# Patient Record
Sex: Female | Born: 1995 | Race: Black or African American | Hispanic: No | Marital: Single | Smoking: Current every day smoker
Health system: Southern US, Community
[De-identification: ages and names within clinical notes are randomized; demographics above are authoritative.]

---

## 2013-09-07 ENCOUNTER — Emergency Department: Payer: Self-pay | Admitting: Emergency Medicine

## 2013-09-07 LAB — COMPREHENSIVE METABOLIC PANEL
ALT: 16 U/L (ref 12–78)
ANION GAP: 7 (ref 7–16)
AST: 20 U/L (ref 0–26)
Albumin: 4.1 g/dL (ref 3.8–5.6)
Alkaline Phosphatase: 116 U/L
BILIRUBIN TOTAL: 0.5 mg/dL (ref 0.2–1.0)
BUN: 13 mg/dL (ref 7–18)
CHLORIDE: 103 mmol/L (ref 98–107)
CO2: 21 mmol/L (ref 21–32)
Calcium, Total: 9.9 mg/dL (ref 9.0–10.7)
Creatinine: 0.94 mg/dL (ref 0.60–1.30)
EGFR (African American): >60
GLUCOSE: 119 mg/dL — AB (ref 65–99)
OSMOLALITY: 264 (ref 275–301)
Potassium: 3.7 mmol/L (ref 3.5–5.1)
SODIUM: 131 mmol/L — AB (ref 136–145)
TOTAL PROTEIN: 9.3 g/dL — AB (ref 6.4–8.6)

## 2013-09-07 LAB — URINALYSIS, COMPLETE
Blood: NEGATIVE
Glucose,UR: NEGATIVE mg/dL (ref 0–75)
Nitrite: POSITIVE
Ph: 5 (ref 4.5–8.0)
Specific Gravity: 1.035 (ref 1.003–1.030)
WBC UR: 30 /HPF (ref 0–5)

## 2013-09-07 LAB — CBC WITH DIFFERENTIAL/PLATELET
BASOS PCT: 0.2 %
Basophil #: 0 10*3/uL (ref 0.0–0.1)
Eosinophil #: 0 10*3/uL (ref 0.0–0.7)
Eosinophil %: 0.1 %
HCT: 45.1 % (ref 35.0–47.0)
HGB: 14.8 g/dL (ref 12.0–16.0)
LYMPHS PCT: 6 %
Lymphocyte #: 1.1 10*3/uL (ref 1.0–3.6)
MCH: 31.1 pg (ref 26.0–34.0)
MCHC: 32.7 g/dL (ref 32.0–36.0)
MCV: 95 fL (ref 80–100)
Monocyte #: 1.3 x10 3/mm — ABNORMAL HIGH (ref 0.2–0.9)
Monocyte %: 7.2 %
NEUTROS ABS: 16.2 10*3/uL — AB (ref 1.4–6.5)
NEUTROS PCT: 86.5 %
PLATELETS: 284 10*3/uL (ref 150–440)
RBC: 4.75 10*6/uL (ref 3.80–5.20)
RDW: 13.8 % (ref 11.5–14.5)
WBC: 18.7 10*3/uL — ABNORMAL HIGH (ref 3.6–11.0)

## 2013-09-07 LAB — LIPASE, BLOOD: Lipase: 55 U/L — ABNORMAL LOW (ref 73–393)

## 2013-09-07 LAB — WET PREP, GENITAL

## 2013-09-07 LAB — TROPONIN I

## 2013-09-07 LAB — GC/CHLAMYDIA PROBE AMP

## 2014-02-09 ENCOUNTER — Emergency Department: Payer: Self-pay | Admitting: Emergency Medicine

## 2014-02-09 LAB — URINALYSIS, COMPLETE
BILIRUBIN, UR: NEGATIVE
Glucose,UR: NEGATIVE mg/dL (ref 0–75)
Ketone: NEGATIVE
Leukocyte Esterase: NEGATIVE
NITRITE: NEGATIVE
Ph: 6 (ref 4.5–8.0)
Protein: 30
Specific Gravity: 1.021 (ref 1.003–1.030)

## 2014-02-09 LAB — CBC
HCT: 36.4 % (ref 35.0–47.0)
HGB: 12 g/dL (ref 12.0–16.0)
MCH: 31 pg (ref 26.0–34.0)
MCHC: 33 g/dL (ref 32.0–36.0)
MCV: 94 fL (ref 80–100)
Platelet: 185 10*3/uL (ref 150–440)
RBC: 3.87 10*6/uL (ref 3.80–5.20)
RDW: 13.6 % (ref 11.5–14.5)
WBC: 8.2 10*3/uL (ref 3.6–11.0)

## 2014-02-09 LAB — HCG, QUANTITATIVE, PREGNANCY: BETA HCG, QUANT.: 29713 m[IU]/mL — AB

## 2014-02-15 ENCOUNTER — Encounter: Payer: Self-pay | Admitting: Obstetrics and Gynecology

## 2014-03-08 ENCOUNTER — Encounter: Payer: Self-pay | Admitting: Maternal & Fetal Medicine

## 2014-08-05 ENCOUNTER — Inpatient Hospital Stay: Payer: Self-pay

## 2014-08-05 LAB — COMPREHENSIVE METABOLIC PANEL
Albumin: 2.5 g/dL — ABNORMAL LOW (ref 3.4–5.0)
Alkaline Phosphatase: 242 U/L — ABNORMAL HIGH (ref 46–116)
Anion Gap: 8 (ref 7–16)
BILIRUBIN TOTAL: 0.3 mg/dL (ref 0.2–1.0)
BUN: 6 mg/dL — ABNORMAL LOW (ref 7–18)
CREATININE: 0.56 mg/dL — AB (ref 0.60–1.30)
Calcium, Total: 8.7 mg/dL (ref 8.5–10.1)
Chloride: 106 mmol/L (ref 98–107)
Co2: 22 mmol/L (ref 21–32)
EGFR (Non-African Amer.): >60
GLUCOSE: 91 mg/dL (ref 65–99)
Osmolality: 269 (ref 275–301)
Potassium: 4 mmol/L (ref 3.5–5.1)
SGOT(AST): 19 U/L (ref 15–37)
SGPT (ALT): 17 U/L (ref 14–63)
SODIUM: 136 mmol/L (ref 136–145)
Total Protein: 6.7 g/dL (ref 6.4–8.2)

## 2014-08-05 LAB — CBC WITH DIFFERENTIAL/PLATELET
Basophil #: 0.1 10*3/uL (ref 0.0–0.1)
Basophil %: 0.5 %
Eosinophil #: 0 10*3/uL (ref 0.0–0.7)
Eosinophil %: 0.4 %
HCT: 35.6 % (ref 35.0–47.0)
HGB: 11.7 g/dL — ABNORMAL LOW (ref 12.0–16.0)
Lymphocyte #: 1.8 10*3/uL (ref 1.0–3.6)
Lymphocyte %: 17.2 %
MCH: 29 pg (ref 26.0–34.0)
MCHC: 32.8 g/dL (ref 32.0–36.0)
MCV: 88 fL (ref 80–100)
MONOS PCT: 6.1 %
Monocyte #: 0.6 x10 3/mm (ref 0.2–0.9)
Neutrophil #: 7.8 10*3/uL — ABNORMAL HIGH (ref 1.4–6.5)
Neutrophil %: 75.8 %
PLATELETS: 224 10*3/uL (ref 150–440)
RBC: 4.03 10*6/uL (ref 3.80–5.20)
RDW: 15.6 % — AB (ref 11.5–14.5)
WBC: 10.2 10*3/uL (ref 3.6–11.0)

## 2014-08-05 LAB — GC/CHLAMYDIA PROBE AMP

## 2014-08-05 LAB — CREATININE, URINE, RANDOM: Creatinine, Urine Random: 143.5 mg/dL — ABNORMAL HIGH (ref 30.0–125.0)

## 2014-08-06 LAB — HEMATOCRIT: HCT: 32.1 % — AB (ref 35.0–47.0)

## 2014-11-06 NOTE — H&P (Signed)
L&D Evaluation:  History:  HPI Pt is a [age] yo pt of [facility] G1P0 at 39.[redacted] weeks GA with an EDC of 08/06/14 presents with reports of contractions and leaking fluid since 7am. She reports going to the bathroom and afterwards her underwear continued to be wet. Her prenatal course is significant for UTI and size discrepency on u/s. She was seen by [facility] for this but no further records are in the chart. She is O+, RI and VI by vaccine and disease, and GBS+.   Presents with contractions, leaking fluid   Medications Pre Natal Vitamins   Allergies NKDA   Social History none   Family History Non-Contributory   ROS:  ROS All systems were reviewed.  HEENT, CNS, GI, GU, Respiratory, CV, Renal and Musculoskeletal systems were found to be normal.   Exam:  Vital Signs mildly elevated BP 140-150s/80s   General no apparent distress   Mental Status clear   Chest clear   Abdomen gravid, tender with contractions   Pelvic 1/75/-1 upon admission. Currently 1.5/80/-2   Mebranes Ruptured, +ferning and nitrazine, +pooling   Description clear   FHT normal rate with no decels, 135, moderate variability, +accels, no decels   Ucx irregular, every 2-5   Skin dry, no lesions, no rashes   Lymph no lymphadenopathy   Other negative PIH labs   Impression:  Impression early labor, IUP at 39.6, SROM   Plan:  Plan EFM/NST, ambulate, consider pitocin if contraction do not cause cervical change.   Follow Up Appointment need to schedule. in 6 weeks   Electronic Signatures: Jannet MantisSubudhi, Floye Fesler (CNM)  (Signed 07-Feb-16 19:07)  Authored: L&D Evaluation   Last Updated: 07-Feb-16 19:07 by Jannet MantisSubudhi, Khloey Chern (CNM)

## 2017-08-10 ENCOUNTER — Encounter: Payer: Self-pay | Admitting: Emergency Medicine

## 2017-08-10 ENCOUNTER — Other Ambulatory Visit: Payer: Self-pay

## 2017-08-10 ENCOUNTER — Emergency Department
Admission: EM | Admit: 2017-08-10 | Discharge: 2017-08-10 | Disposition: A | Discharge status: Home / Self Care | Payer: Self-pay | Attending: Emergency Medicine | Admitting: Emergency Medicine

## 2017-08-10 ENCOUNTER — Emergency Department: Payer: Self-pay

## 2017-08-10 DIAGNOSIS — B9689 Other specified bacterial agents as the cause of diseases classified elsewhere: Secondary | ICD-10-CM

## 2017-08-10 DIAGNOSIS — N390 Urinary tract infection, site not specified: Secondary | ICD-10-CM | POA: Insufficient documentation

## 2017-08-10 DIAGNOSIS — F17200 Nicotine dependence, unspecified, uncomplicated: Secondary | ICD-10-CM | POA: Insufficient documentation

## 2017-08-10 DIAGNOSIS — O4690 Antepartum hemorrhage, unspecified, unspecified trimester: Secondary | ICD-10-CM

## 2017-08-10 DIAGNOSIS — O200 Threatened abortion: Secondary | ICD-10-CM | POA: Insufficient documentation

## 2017-08-10 DIAGNOSIS — N760 Acute vaginitis: Secondary | ICD-10-CM | POA: Insufficient documentation

## 2017-08-10 LAB — CBC WITH DIFFERENTIAL/PLATELET
BASOS PCT: 0 %
Basophils Absolute: 0 10*3/uL (ref 0–0.1)
EOS ABS: 0.1 10*3/uL (ref 0–0.7)
Eosinophils Relative: 2 %
HEMATOCRIT: 40.5 % (ref 35.0–47.0)
HEMOGLOBIN: 13.5 g/dL (ref 12.0–16.0)
Lymphocytes Relative: 25 %
Lymphs Abs: 1.6 10*3/uL (ref 1.0–3.6)
MCH: 31 pg (ref 26.0–34.0)
MCHC: 33.2 g/dL (ref 32.0–36.0)
MCV: 93.5 fL (ref 80.0–100.0)
Monocytes Absolute: 0.6 10*3/uL (ref 0.2–0.9)
Monocytes Relative: 9 %
NEUTROS ABS: 4.2 10*3/uL (ref 1.4–6.5)
NEUTROS PCT: 64 %
Platelets: 229 10*3/uL (ref 150–440)
RBC: 4.34 MIL/uL (ref 3.80–5.20)
RDW: 13.7 % (ref 11.5–14.5)
WBC: 6.6 10*3/uL (ref 3.6–11.0)

## 2017-08-10 LAB — URINALYSIS, ROUTINE W REFLEX MICROSCOPIC
Bilirubin Urine: NEGATIVE
GLUCOSE, UA: NEGATIVE mg/dL
KETONES UR: NEGATIVE mg/dL
Nitrite: POSITIVE — AB
PH: 5 (ref 5.0–8.0)
PROTEIN: 30 mg/dL — AB
Specific Gravity, Urine: 1.019 (ref 1.005–1.030)

## 2017-08-10 LAB — CHLAMYDIA/NGC RT PCR (ARMC ONLY)
Chlamydia Tr: NOT DETECTED
N GONORRHOEAE: NOT DETECTED

## 2017-08-10 LAB — BASIC METABOLIC PANEL
ANION GAP: 5 (ref 5–15)
BUN: 7 mg/dL (ref 6–20)
CALCIUM: 8.8 mg/dL — AB (ref 8.9–10.3)
CO2: 24 mmol/L (ref 22–32)
CREATININE: 0.67 mg/dL (ref 0.44–1.00)
Chloride: 107 mmol/L (ref 101–111)
GFR calc non Af Amer: >60 mL/min (ref >60)
Glucose, Bld: 104 mg/dL — ABNORMAL HIGH (ref 65–99)
Potassium: 3.4 mmol/L — ABNORMAL LOW (ref 3.5–5.1)
SODIUM: 136 mmol/L (ref 135–145)

## 2017-08-10 LAB — WET PREP, GENITAL
Sperm: NONE SEEN
TRICH WET PREP: NONE SEEN
YEAST WET PREP: NONE SEEN

## 2017-08-10 LAB — POCT PREGNANCY, URINE: Preg Test, Ur: POSITIVE — AB

## 2017-08-10 LAB — ABO/RH: ABO/RH(D): O POS

## 2017-08-10 LAB — HCG, QUANTITATIVE, PREGNANCY: HCG, BETA CHAIN, QUANT, S: 7113 m[IU]/mL — AB (ref <5)

## 2017-08-10 MED ORDER — SODIUM CHLORIDE 0.9 % IV SOLN
1.0000 g | Freq: Once | INTRAVENOUS | Status: DC
  Filled 2017-08-10: qty 10

## 2017-08-10 MED ORDER — CEPHALEXIN 500 MG PO CAPS
500.0000 mg | ORAL_CAPSULE | Freq: Once | ORAL | Status: AC
  Administered 2017-08-10: 500 mg via ORAL
  Filled 2017-08-10: qty 1

## 2017-08-10 MED ORDER — CEPHALEXIN 500 MG PO CAPS: 500.0000 mg | ORAL_CAPSULE | Freq: Two times a day (BID) | ORAL | 0 refills | Status: AC

## 2017-08-10 NOTE — ED Notes (Signed)
Pt verbalizes d/c understanding and follow up with RX. Pt in NAD , VS stable

## 2017-08-10 NOTE — ED Notes (Signed)
Moderate vaginal bleeding that began last night. Pt last period in December. Denies pain.

## 2017-08-10 NOTE — Discharge Instructions (Signed)
You were evaluated for positive pregnancy test and bleeding, and right now the ultrasound shows approximately 6-week pregnancy with a heartbeat.  You are found to have a urinary tract infection, complete your antibiotic course.  Your vaginal swabs show bacterial overgrowth called bacterial vaginosis.  Please discuss with your OB/GYN whether or not they would like to treat now versus wait until the second trimester.  Return to the emergency department immediately for any worsening condition including uncontrolled or heavy bleeding, dizziness or passing out, abdominal pain, or any other symptoms concerning to you.

## 2017-08-10 NOTE — ED Provider Notes (Signed)
[facility] Emergency Department Provider Note ____________________________________________   I have reviewed the triage vital signs and the triage nursing note.  HISTORY  Chief Complaint Vaginal Bleeding   Historian Patient  HPI Kelly Schwartz is a 22 y.o. female G4 or 5 per the patient, one live birth, others were abortions, reports that she took a pregnancy test at home that looked like it was positive.  She has had some moderate spotting/vaginal bleeding today.  No significant abdominal pain.  Denies urinary symptoms.  Symptoms mild.   History reviewed. No pertinent past medical history.  There are no active problems to display for this patient.   History reviewed. No pertinent surgical history.  Prior to Admission medications   Medication Sig Start Date End Date Taking? Authorizing Provider  cephALEXin (KEFLEX) 500 MG capsule Take 1 capsule (500 mg total) by mouth 2 (two) times daily. 08/10/17   Governor Rooks, MD    No Known Allergies  No family history on file.  Social History Social History   Tobacco Use  . Smoking status: Current Every Day Smoker    Packs/day: 0.50  Substance Use Topics  . Alcohol use: No    Frequency: Never  . Drug use: No    Review of Systems  Constitutional: Negative for fever. Eyes: Negative for visual changes. ENT: Negative for sore throat. Cardiovascular: Negative for chest pain. Respiratory: Negative for shortness of breath. Gastrointestinal: Negative for abdominal pain, vomiting and diarrhea. Genitourinary: Negative for dysuria. Musculoskeletal: Negative for back pain. Skin: Negative for rash. Neurological: Negative for headache.  ____________________________________________   PHYSICAL EXAM:  VITAL SIGNS: ED Triage Vitals  Enc Vitals Group     BP 08/10/17 1156 (!) 126/53     Pulse Rate 08/10/17 1156 78     Resp 08/10/17 1156 18     Temp 08/10/17 1156 98.4 F (36.9 C)     Temp  Source 08/10/17 1156 Oral     SpO2 08/10/17 1156 100 %     Weight 08/10/17 1157 160 lb (72.6 kg)     Height 08/10/17 1157 4\' 11"  (1.499 m)     Head Circumference --      Peak Flow --      Pain Score 08/10/17 1157 0     Pain Loc --      Pain Edu? --      Excl. in GC? --      Constitutional: Alert and oriented. Well appearing and in no distress. HEENT   Head: Normocephalic and atraumatic.      Eyes: Conjunctivae are normal. Pupils equal and round.       Ears:         Nose: No congestion/rhinnorhea.   Mouth/Throat: Mucous membranes are moist.   Neck: No stridor. Cardiovascular/Chest: Normal rate, regular rhythm.  No murmurs, rubs, or gallops. Respiratory: Normal respiratory effort without tachypnea nor retractions. Breath sounds are clear and equal bilaterally. No wheezes/rales/rhonchi. Gastrointestinal: Soft. No distention, no guarding, no rebound. Nontender.    Genitourinary/rectal: Small amount of vaginal blood in the vault, closed cervix.  Not on tender adnexa and no cervicitis/PID symptoms. Musculoskeletal: Nontender with normal range of motion in all extremities. No joint effusions.  No lower extremity tenderness.  No edema. Neurologic:  Normal speech and language. No gross or focal neurologic deficits are appreciated. Skin:  Skin is warm, dry and intact. No rash noted. Psychiatric: Mood and affect are normal. Speech and behavior are normal. Patient exhibits appropriate insight and  judgment.   ____________________________________________  LABS (pertinent positives/negatives) I, Governor Rooksebecca Symphonie Schneiderman, MD the attending physician have reviewed the labs noted below.  Labs Reviewed  WET PREP, GENITAL - Abnormal; Notable for the following components:      Result Value   Clue Cells Wet Prep HPF POC PRESENT (*)    WBC, Wet Prep HPF POC FEW (*)    All other components within normal limits  URINALYSIS, ROUTINE W REFLEX MICROSCOPIC - Abnormal; Notable for the following components:    Color, Urine AMBER (*)    APPearance CLOUDY (*)    Hgb urine dipstick LARGE (*)    Protein, ur 30 (*)    Nitrite POSITIVE (*)    Leukocytes, UA SMALL (*)    Bacteria, UA MANY (*)    Squamous Epithelial / LPF 6-30 (*)    All other components within normal limits  BASIC METABOLIC PANEL - Abnormal; Notable for the following components:   Potassium 3.4 (*)    Glucose, Bld 104 (*)    Calcium 8.8 (*)    All other components within normal limits  HCG, QUANTITATIVE, PREGNANCY - Abnormal; Notable for the following components:   hCG, Beta Chain, Quant, S 7,113 (*)    All other components within normal limits  POCT PREGNANCY, URINE - Abnormal; Notable for the following components:   Preg Test, Ur POSITIVE (*)    All other components within normal limits  CHLAMYDIA/NGC RT PCR ([facility] ONLY)  URINE CULTURE  CBC WITH DIFFERENTIAL/PLATELET  POC URINE PREG, ED  ABO/RH    ____________________________________________  RADIOLOGY All Xrays were viewed by me.  Imaging interpreted by Radiologist, and I, Governor Rooksebecca Lenzi Marmo, MD the attending physician have reviewed the radiologist interpretation noted below.  Ultrasound pelvic and transvaginal:  IMPRESSION: Single viable intrauterine pregnancy at 6 weeks 4 days. Fetal heart rate 112 beats per minute. Small amount of fluid noted adjacent to the left ovary. __________________________________________  PROCEDURES  Procedure(s) performed: None  Critical Care performed: None   ____________________________________________  ED COURSE / ASSESSMENT AND PLAN  Pertinent labs & imaging results that were available during my care of the patient were reviewed by me and considered in my medical decision making (see chart for details).    Patient with stable vital signs, complaining of vaginal bleeding with positive pregnancy test.  Ultrasound shows live IUP, discussed first trimester bleeding precautions with patient and concern about possible  threatened miscarriage.  She is also found to have a urinary tract infection and will be discharged with Keflex.  Vaginal swab positive for bacterial vaginosis, will leave discretion of treatment for pectoral vaginosis to the OB/GYN doctor.  DIFFERENTIAL DIAGNOSIS: Differential diagnosis includes, but is not limited to, threatened miscarriage, incomplete miscarriage, normal bleeding from an early trimester pregnancy, ectopic pregnancy, , blighted ovum, vaginal/cervical trauma, subchorionic hemorrhage/hematoma, etc.   CONSULTATIONS: None   Patient / Family / Caregiver informed of clinical course, medical decision-making process, and agree with plan.   I discussed return precautions, follow-up instructions, and discharge instructions with patient and/or family.  Discharge Instructions : You were evaluated for positive pregnancy test and bleeding, and right now the ultrasound shows approximately 6-week pregnancy with a heartbeat.  You are found to have a urinary tract infection, complete your antibiotic course.  Your vaginal swabs show bacterial overgrowth called bacterial vaginosis.  Please discuss with your OB/GYN whether or not they would like to treat now versus wait until the second trimester.  Return to the emergency department immediately for any  worsening condition including uncontrolled or heavy bleeding, dizziness or passing out, abdominal pain, or any other symptoms concerning to you.    ___________________________________________   FINAL CLINICAL IMPRESSION(S) / ED DIAGNOSES   Final diagnoses:  Vaginal bleeding in pregnancy  BV (bacterial vaginosis)  Threatened miscarriage  Urinary tract infection without hematuria, site unspecified      ___________________________________________        Note: This dictation was prepared with Dragon dictation. Any transcriptional errors that result from this process are unintentional    Governor Rooks, MD 08/10/17  1513

## 2017-08-10 NOTE — ED Triage Notes (Signed)
Pt reports that she took a pregnancy test last week, it said it was positive. She states that when she wiped this am she had a pink bloody show on the tissue paper. Denies any cramping. Last period she had was in December.

## 2017-08-13 ENCOUNTER — Emergency Department
Admission: EM | Admit: 2017-08-13 | Discharge: 2017-08-13 | Disposition: A | Discharge status: Home / Self Care | Payer: Self-pay

## 2017-08-13 NOTE — ED Notes (Signed)
Pt up to stat desk letting registration know that she has decided to leave due to wait times, this tech advised pt to stay and pt insisted on leaving, RN Fleet ContrasRachel notified.

## 2017-08-14 ENCOUNTER — Emergency Department: Payer: Self-pay

## 2017-08-14 ENCOUNTER — Emergency Department
Admission: EM | Admit: 2017-08-14 | Discharge: 2017-08-14 | Disposition: A | Discharge status: Home / Self Care | Payer: Self-pay | Attending: Emergency Medicine | Admitting: Emergency Medicine

## 2017-08-14 ENCOUNTER — Other Ambulatory Visit: Payer: Self-pay

## 2017-08-14 ENCOUNTER — Encounter: Payer: Self-pay | Admitting: Emergency Medicine

## 2017-08-14 DIAGNOSIS — R102 Pelvic and perineal pain: Secondary | ICD-10-CM | POA: Insufficient documentation

## 2017-08-14 DIAGNOSIS — Z3A01 Less than 8 weeks gestation of pregnancy: Secondary | ICD-10-CM | POA: Insufficient documentation

## 2017-08-14 DIAGNOSIS — O039 Complete or unspecified spontaneous abortion without complication: Secondary | ICD-10-CM | POA: Insufficient documentation

## 2017-08-14 DIAGNOSIS — F17200 Nicotine dependence, unspecified, uncomplicated: Secondary | ICD-10-CM | POA: Insufficient documentation

## 2017-08-14 LAB — HCG, QUANTITATIVE, PREGNANCY: HCG, BETA CHAIN, QUANT, S: 349 m[IU]/mL — AB (ref <5)

## 2017-08-14 NOTE — ED Notes (Signed)
Dr. Brown at bedside

## 2017-08-14 NOTE — ED Triage Notes (Signed)
Pt to ed with c/o vaginal bleeding, passing clots and mild cramping.  Pt states she is approx 6 weeks preg.  Pt states recently seen here and started on ABX for infection.

## 2017-08-14 NOTE — ED Notes (Signed)
Pt states she is 6 weeks 4 days pregnant. Having vaginal bleeding x 1 week. States clots present. 1-2 pads per hour. Alert, oriented, ambulatory. No distress noted. "2nd active pregnant, I've hard a few abortions."

## 2017-08-14 NOTE — ED Provider Notes (Signed)
[facility] Emergency Department Provider Note    First MD Initiated Contact with Patient 08/14/17 1738     (approximate)  I have reviewed the triage vital signs and the nursing notes.   HISTORY  Chief Complaint Vaginal Bleeding    HPI Kelly Schwartz is a 22 y.o. female G4P1 (2 elective abortions) approximately [redacted] weeks pregnant presents to the emergency department with increasing vaginal bleeding and clots with mild pelvic cramping times 2 days.  Patient was seen in the emergency department on 08/10/2017 for similar complaint at which point a single live IUP was noted on ultrasound with a beta quantitative of 7113.  Patient states that she is passing large clots.  Current pain score is 0   Past medical history 2 elective abortions. There are no active problems to display for this patient.   Past surgical history None  Prior to Admission medications   Medication Sig Start Date End Date Taking? Authorizing Provider  cephALEXin (KEFLEX) 500 MG capsule Take 1 capsule (500 mg total) by mouth 2 (two) times daily. 08/10/17   Governor RooksLord, Rebecca, MD    Allergies No known drug allergies History reviewed. No pertinent family history.  Social History Social History   Tobacco Use  . Smoking status: Current Every Day Smoker    Packs/day: 0.50  . Smokeless tobacco: Never Used  Substance Use Topics  . Alcohol use: No    Frequency: Never  . Drug use: No    Review of Systems Constitutional: No fever/chills Eyes: No visual changes. ENT: No sore throat. Cardiovascular: Denies chest pain. Respiratory: Denies shortness of breath. Gastrointestinal: No abdominal pain.  No nausea, no vomiting.  No diarrhea.  No constipation. Genitourinary: Positive for vaginal bleeding Musculoskeletal: Negative for neck pain.  Negative for back pain. Integumentary: Negative for rash. Neurological: Negative for headaches, focal weakness or  numbness.   ____________________________________________   PHYSICAL EXAM:  VITAL SIGNS: ED Triage Vitals [08/14/17 1504]  Enc Vitals Group     BP 116/70     Pulse Rate 64     Resp 16     Temp 97.8 F (36.6 C)     Temp Source Oral     SpO2 100 %     Weight 72.6 kg (160 lb)     Height      Head Circumference      Peak Flow      Pain Score 0     Pain Loc      Pain Edu?      Excl. in GC?     Constitutional: Alert and oriented. Well appearing and in no acute distress. Eyes: Conjunctivae are normal. Head: Atraumatic. Nose: No congestion/rhinnorhea. Mouth/Throat: Mucous membranes are moist. Oropharynx non-erythematous. Neck: No stridor.  Cardiovascular: Normal rate, regular rhythm. Good peripheral circulation. Grossly normal heart sounds. Respiratory: Normal respiratory effort.  No retractions. Lungs CTAB. Genitourinary: Cervical os closed moderate vaginal bleeding noted Gastrointestinal: Soft and nontender. No distention.  Musculoskeletal: No lower extremity tenderness nor edema. No gross deformities of extremities. Neurologic:  Normal speech and language. No gross focal neurologic deficits are appreciated.  Skin:  Skin is warm, dry and intact. No rash noted. Psychiatric: Mood and affect are normal. Speech and behavior are normal.  ____________________________________________   LABS (all labs ordered are listed, but only abnormal results are displayed)  Labs Reviewed  HCG, QUANTITATIVE, PREGNANCY - Abnormal; Notable for the following components:      Result Value   hCG, Beta  Chain, Quant, S 349 (*)    All other components within normal limits   _  RADIOLOGY I, Comfrey N Haleemah Buckalew, personally viewed and evaluated these images (plain radiographs) as part of my medical decision making, as well as reviewing the written report by the radiologist.   Official radiology report(s): US Ob Comp Less 14 Wks  Result Date: 08/14/2017 CLINICAL DATA:  Vaginal bleeding. A single  intrauterine pregnancy with seen with ultrasound on August 10, 2017. EXAM: OBSTETRIC <14 WK ULTRASOUND TECHNIQUE: Transabdominal ultrasound was performed for evaluation of the gestation as well as the maternal uterus and adnexal regions. COMPARISON:  August 10, 2017 FINDINGS: Intrauterine gestational sac: None Maternal uterus/adnexae: The ovaries are normal in appearance. A posterior fibroid in the uterus measures up to 3.3 cm. The endometrium is non thickened, measuring 3.3 mm. IMPRESSION: 1. The intrauterine pregnancy identified August 10, 2017 is no longer visualized. Electronically Signed   By: Gerome Sam III M.D   On: 08/14/2017 19:55   US Ob Transvaginal  Result Date: 08/14/2017 CLINICAL DATA:  Vaginal bleeding. A single intrauterine pregnancy with seen with ultrasound on August 10, 2017. EXAM: OBSTETRIC <14 WK ULTRASOUND TECHNIQUE: Transabdominal ultrasound was performed for evaluation of the gestation as well as the maternal uterus and adnexal regions. COMPARISON:  August 10, 2017 FINDINGS: Intrauterine gestational sac: None Maternal uterus/adnexae: The ovaries are normal in appearance. A posterior fibroid in the uterus measures up to 3.3 cm. The endometrium is non thickened, measuring 3.3 mm. IMPRESSION: 1. The intrauterine pregnancy identified August 10, 2017 is no longer visualized. Electronically Signed   By: Gerome Sam III M.D   On: 08/14/2017 19:55     Procedures   ____________________________________________   INITIAL IMPRESSION / ASSESSMENT AND PLAN / ED COURSE  As part of my medical decision making, I reviewed the following data within the electronic MEDICAL RECORD NUMBER  22 year old female with above-stated history and physical exam with concern for possible complete versus incomplete miscarriage such ultrasound was performed which revealed no evidence of an intrauterine pregnancy which was previously identified on previous ultrasound.  As such suspect completed  miscarriage.  Patient referred to OB/GYN for further outpatient evaluation and    ____________________________________________  FINAL CLINICAL IMPRESSION(S) / ED DIAGNOSES  Final diagnoses:  Miscarriage     MEDICATIONS GIVEN DURING THIS VISIT:  Medications - No data to display   ED Discharge Orders    None       Note:  This document was prepared using Dragon voice recognition software and may include unintentional dictation errors.    Darci Current, MD 08/14/17 2203

## 2018-02-24 IMAGING — US US OB COMP LESS 14 WK
1 series · 14 of 28 positions shown · non-contrast
Comparison: 03/08/2014.

CLINICAL DATA: Vaginal spotting.

EXAM:
OBSTETRIC <14 WK US AND TRANSVAGINAL OB US
TECHNIQUE: Both transabdominal and transvaginal ultrasound examinations were
performed for complete evaluation of the gestation as well as the
maternal uterus, adnexal regions, and pelvic cul-de-sac.
Transvaginal technique was performed to assess early pregnancy.

[Series 1: us ob comp less 14 wk · 0.09mm/px · 14 of 126 slices shown]
[im 5/126]
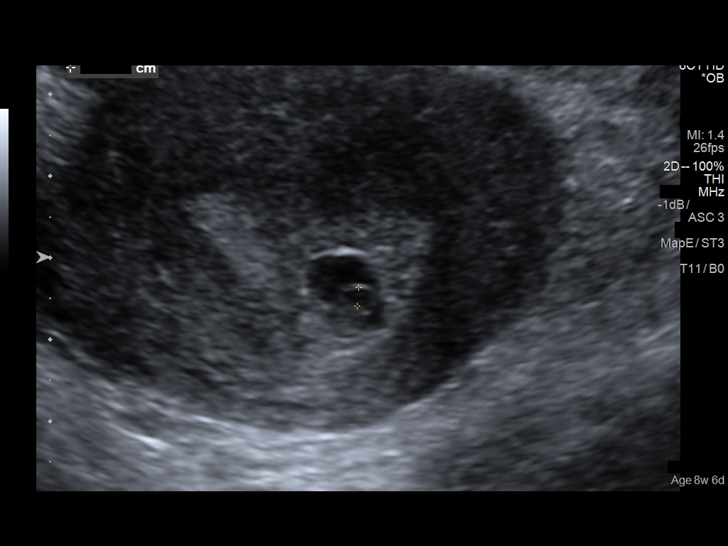
[im 14/126]
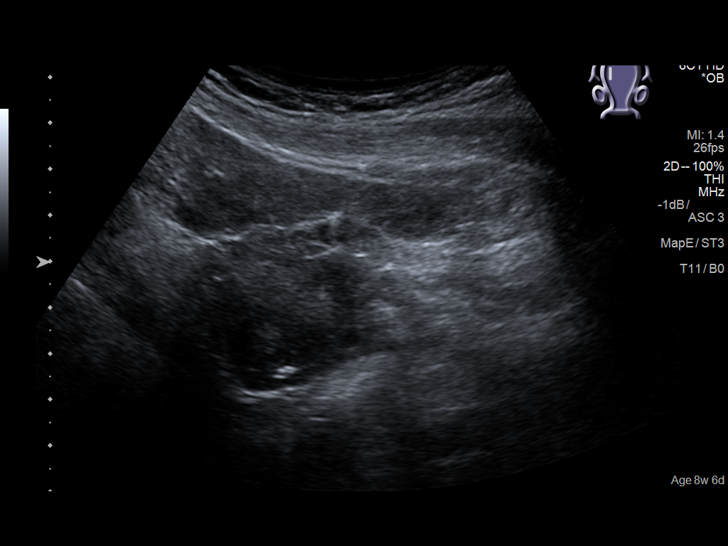
[im 24/126]
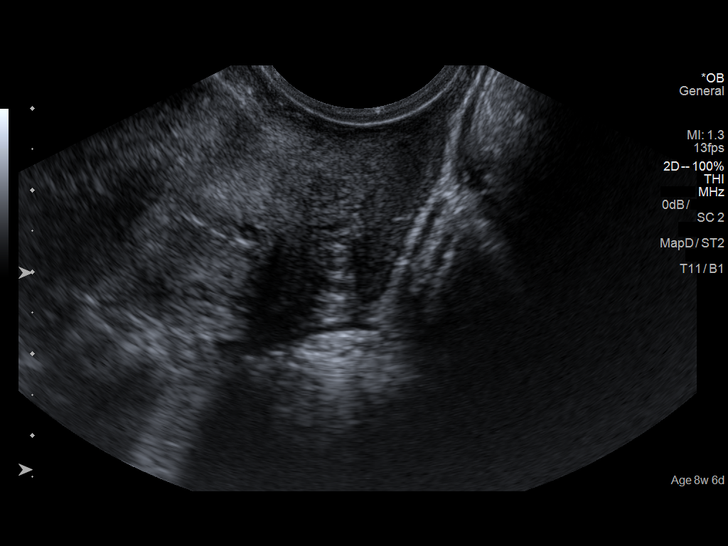
[im 33/126]
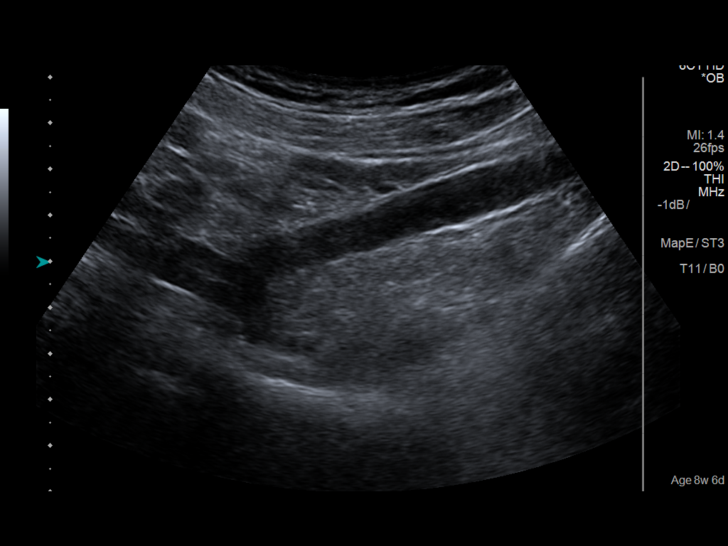
[im 42/126]
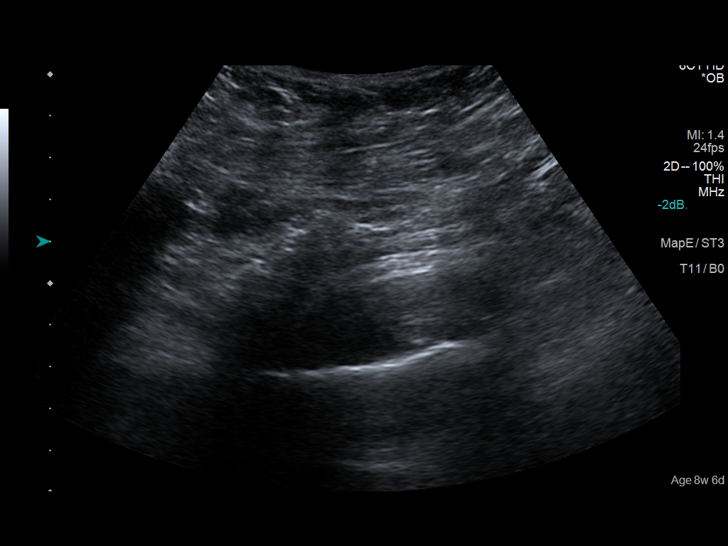
[im 51/126]
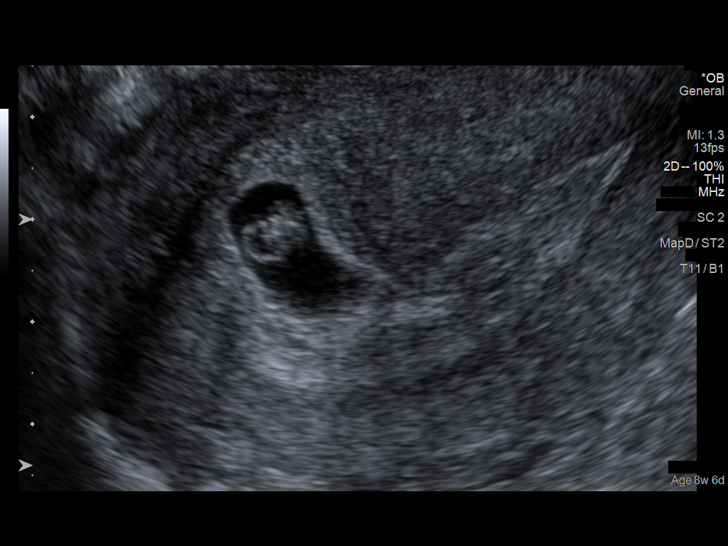
[im 61/126]
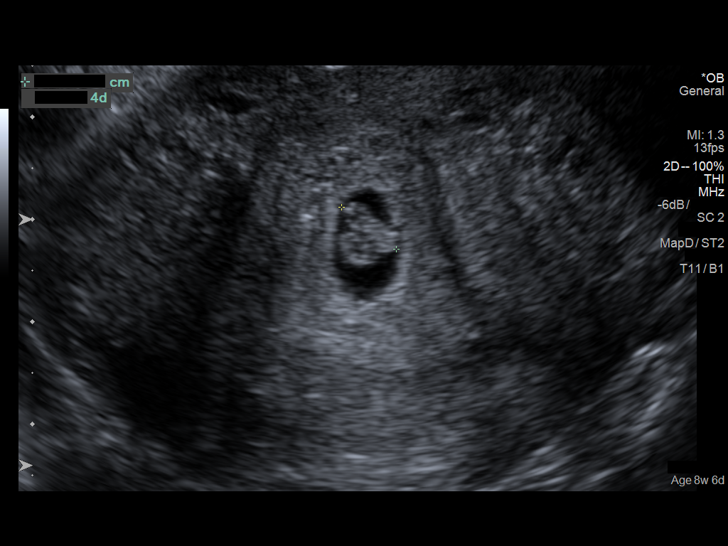
[im 70/126]
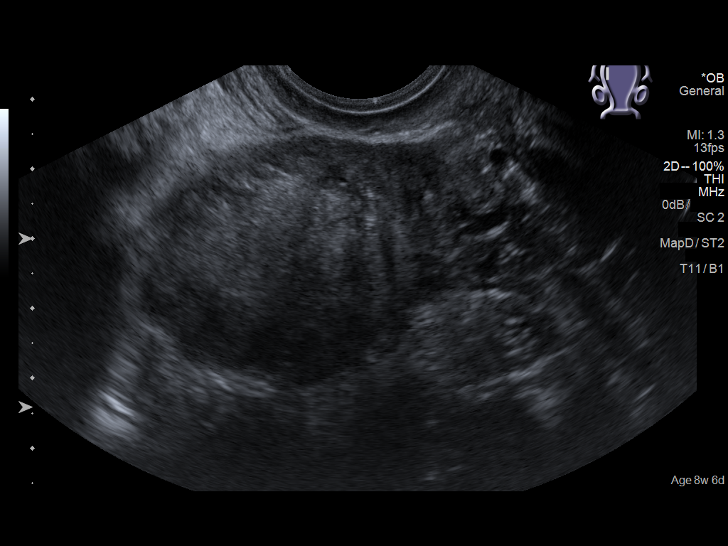
[im 79/126]
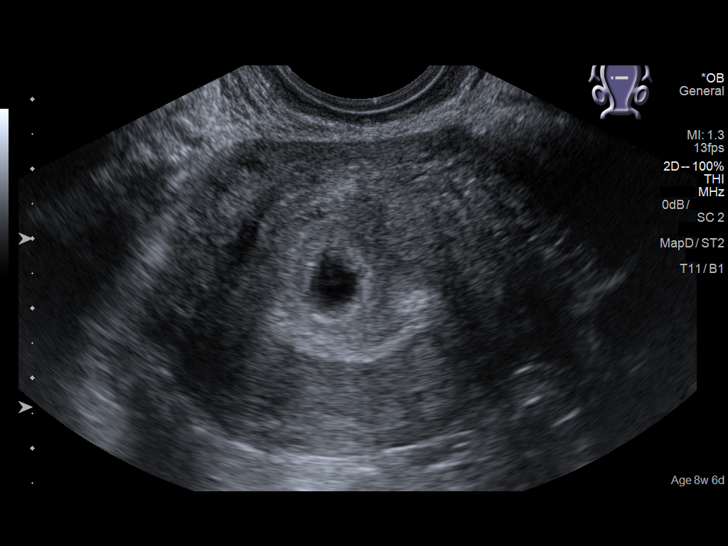
[im 88/126]
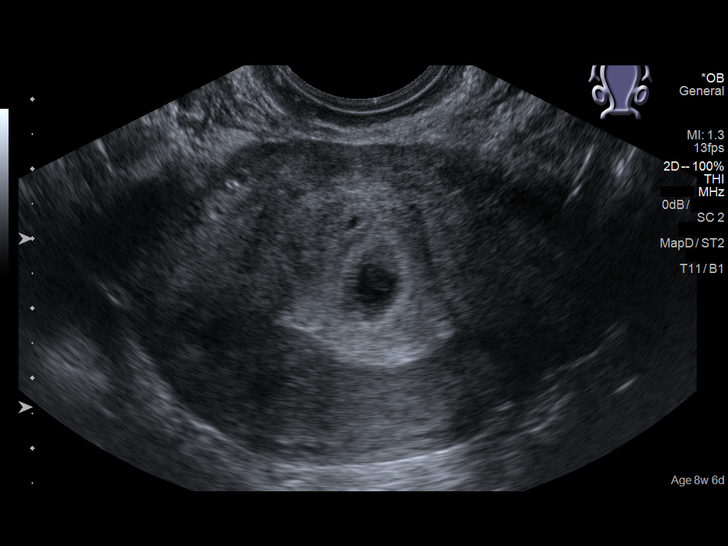
[im 98/126]
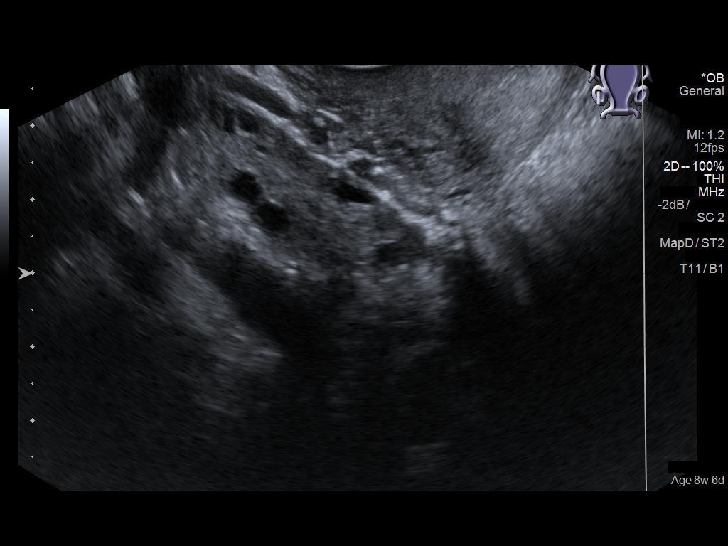
[im 107/126]
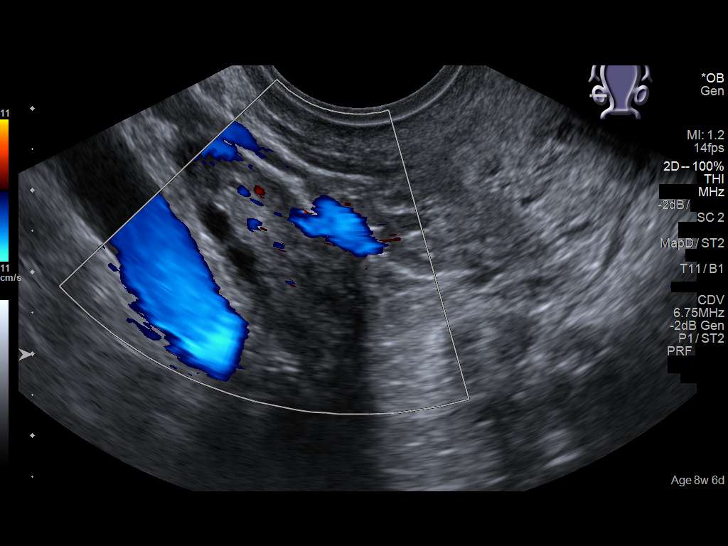
[im 116/126]
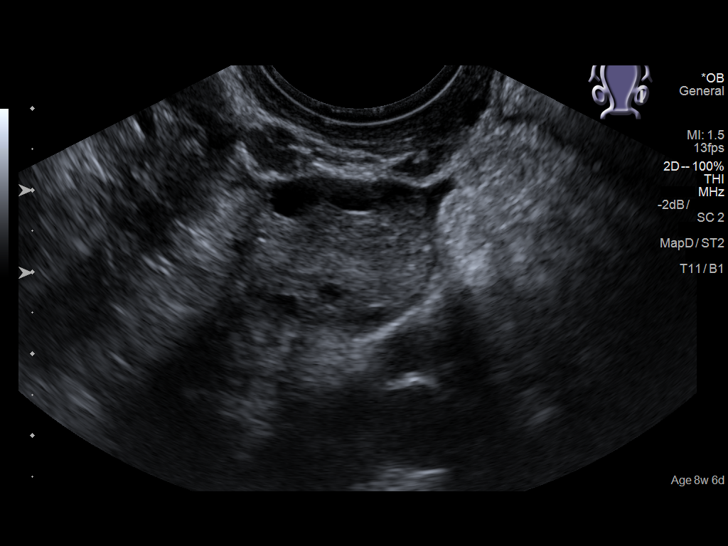
[im 126/126]
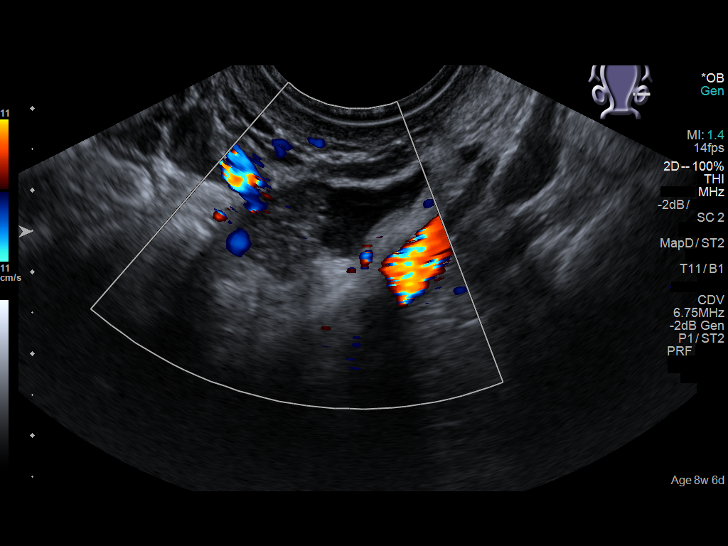

[14 of 28 positions shown; findings below may reference images not displayed]

FINDINGS: Intrauterine gestational sac: Single

Yolk sac:  Present

Embryo:  Present

Cardiac Activity: Present

Heart Rate: 112 bpm

CRL: 6.7 mm   6 w   4 d                  US EDC: 04/01/2018

Subchorionic hemorrhage:  None visualized.

Maternal uterus/adnexae: Small amount of free fluid noted adjacent
to the left adnexa.
IMPRESSION: Single viable intrauterine pregnancy at 6 weeks 4 days. Fetal heart
rate 112 beats per minute. Small amount of fluid noted adjacent to
the left ovary.

## 2019-11-06 ENCOUNTER — Ambulatory Visit: Payer: HRSA Program | Attending: Internal Medicine

## 2019-11-06 DIAGNOSIS — U071 COVID-19: Secondary | ICD-10-CM | POA: Insufficient documentation

## 2019-11-06 DIAGNOSIS — Z20822 Contact with and (suspected) exposure to covid-19: Secondary | ICD-10-CM

## 2019-11-07 LAB — SARS-COV-2, NAA 2 DAY TAT

## 2019-11-07 LAB — NOVEL CORONAVIRUS, NAA: SARS-CoV-2, NAA: DETECTED — AB

## 2020-06-18 ENCOUNTER — Emergency Department: Payer: No Typology Code available for payment source

## 2020-06-18 ENCOUNTER — Encounter: Payer: Self-pay | Admitting: Emergency Medicine

## 2020-06-18 ENCOUNTER — Other Ambulatory Visit: Payer: Self-pay

## 2020-06-18 ENCOUNTER — Emergency Department
Admission: EM | Admit: 2020-06-18 | Discharge: 2020-06-18 | Disposition: A | Discharge status: Home / Self Care | Payer: No Typology Code available for payment source | Attending: Emergency Medicine | Admitting: Emergency Medicine

## 2020-06-18 DIAGNOSIS — F17200 Nicotine dependence, unspecified, uncomplicated: Secondary | ICD-10-CM | POA: Insufficient documentation

## 2020-06-18 DIAGNOSIS — M545 Low back pain, unspecified: Secondary | ICD-10-CM | POA: Insufficient documentation

## 2020-06-18 DIAGNOSIS — Y92410 Unspecified street and highway as the place of occurrence of the external cause: Secondary | ICD-10-CM | POA: Diagnosis not present

## 2020-06-18 LAB — POC URINE PREG, ED: Preg Test, Ur: NEGATIVE

## 2020-06-18 MED ORDER — ACETAMINOPHEN 325 MG PO TABS
650.0000 mg | ORAL_TABLET | Freq: Once | ORAL | Status: AC
  Administered 2020-06-18: 21:00:00 650 mg via ORAL
  Filled 2020-06-18: qty 2

## 2020-06-18 MED ORDER — METHOCARBAMOL 500 MG PO TABS
750.0000 mg | ORAL_TABLET | Freq: Once | ORAL | Status: AC
  Administered 2020-06-18: 21:00:00 750 mg via ORAL
  Filled 2020-06-18: qty 2

## 2020-06-18 MED ORDER — METHOCARBAMOL 750 MG PO TABS: 750.0000 mg | ORAL_TABLET | Freq: Four times a day (QID) | ORAL | 0 refills | Status: AC | PRN

## 2020-06-18 MED ORDER — MELOXICAM 7.5 MG PO TABS
15.0000 mg | ORAL_TABLET | Freq: Once | ORAL | Status: AC
  Administered 2020-06-18: 21:00:00 15 mg via ORAL
  Filled 2020-06-18: qty 2

## 2020-06-18 MED ORDER — MELOXICAM 15 MG PO TABS: 15.0000 mg | ORAL_TABLET | Freq: Every day | ORAL | 0 refills | Status: AC
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
